# Patient Record
Sex: Male | Born: 1955 | Race: White | Hispanic: No | Marital: Married | Smoking: Never smoker
Health system: Southern US, Community
[De-identification: ages and names within clinical notes are randomized; demographics above are authoritative.]

## PROBLEM LIST (undated history)

## (undated) DIAGNOSIS — E7800 Pure hypercholesterolemia, unspecified: Secondary | ICD-10-CM

---

## 2017-04-16 ENCOUNTER — Encounter: Payer: Self-pay | Admitting: Emergency Medicine

## 2017-04-16 ENCOUNTER — Emergency Department (INDEPENDENT_AMBULATORY_CARE_PROVIDER_SITE_OTHER)
Admission: EM | Admit: 2017-04-16 | Discharge: 2017-04-16 | Disposition: A | Discharge status: Home / Self Care | Payer: No Typology Code available for payment source | Source: Home / Self Care | Attending: Family Medicine | Admitting: Family Medicine

## 2017-04-16 DIAGNOSIS — S0501XA Injury of conjunctiva and corneal abrasion without foreign body, right eye, initial encounter: Secondary | ICD-10-CM

## 2017-04-16 MED ORDER — GENTAMICIN SULFATE 0.3 % OP SOLN: 1.0000 [drp] | Freq: Four times a day (QID) | OPHTHALMIC | 0 refills | Status: AC

## 2017-04-16 NOTE — ED Triage Notes (Signed)
[facility] pain in the right eye he advised that approximately 1 hour ago he was struck in the right eye with a rake. Pain 4/10

## 2017-04-16 NOTE — ED Provider Notes (Signed)
CSN: [id number]     Arrival date & time 04/16/17  1628 History   First MD Initiated Contact with Patient 04/16/17 1655     Chief Complaint  Patient presents with  . Eye Injury   (Consider location/radiation/quality/duration/timing/severity/associated sxs/prior Treatment) HPI  Edward Hall is a 61 y.o. male presenting to UC with c/o Right eye pain that started about 1 hour PTA. Pt states lifting a box of items in his garage when a rake came down and hit his Right eye. He had his glasses on but they were knocked off.  Pain is aching, 4/10. No other injuries.    History reviewed. No pertinent past medical history. History reviewed. No pertinent surgical history. History reviewed. No pertinent family history. Social History  Substance Use Topics  . Smoking status: Never Smoker  . Smokeless tobacco: Never Used  . Alcohol use Yes    Review of Systems  Eyes: Positive for photophobia, pain, redness and visual disturbance.    Allergies  Patient has no allergy information on record.  Home Medications   Prior to Admission medications   Medication Sig Start Date End Date Taking? Authorizing Provider  levothyroxine (SYNTHROID, LEVOTHROID) 100 MCG tablet Take 100 mcg by mouth daily before breakfast.   Yes [provider]  simvastatin (ZOCOR) 10 MG tablet Take 10 mg by mouth daily.   Yes [provider]  gentamicin (GARAMYCIN) 0.3 % ophthalmic solution Place 1 drop into the right eye 4 (four) times daily. For 5 days 04/16/17   Lurene ShadowPhelps, Jahlen Bollman O, PA-C   Meds Ordered and Administered this Visit  Medications - No data to display  BP 131/81 (BP Location: Left Arm)   Pulse 92   Temp 98.4 F (36.9 C) (Oral)   Resp 16   Ht 5\' 10"  (1.778 m)   Wt 240 lb (108.9 kg)   SpO2 96%   BMI 34.44 kg/m  No data found.   Physical Exam  Constitutional: He is oriented to person, place, and time. He appears well-developed and well-nourished.  HENT:  Head: Normocephalic and atraumatic.    Eyes: Pupils are equal, round, and reactive to light. EOM and lids are normal. Lids are everted and swept, no foreign bodies found. Right conjunctiva is injected.    Right eye: injected to medial aspect with small corneal abrasion. No foreign bodies seen on exam.   Neck: Normal range of motion.  Cardiovascular: Normal rate.   Pulmonary/Chest: Effort normal.  Musculoskeletal: Normal range of motion.  Neurological: He is alert and oriented to person, place, and time.  Skin: Skin is warm and dry.  Psychiatric: He has a normal mood and affect. His behavior is normal.  Nursing note and vitals reviewed.   Urgent Care Course     Procedures (including critical care time)  Labs Review Labs Reviewed - No data to display  Imaging Review No results found.   Visual Acuity Review  With correction- eye glasses.   Right Eye Distance: 20/40 Left Eye Distance: 20/20 Bilateral Distance: 20/25   MDM   1. Injury of conjunctiva and corneal abrasion of right eye w/o FB, initial encounter    Corneal abrasion to Right eye.  Last tetanus within 5-9 years.   Encouraged to call his PCP on Monday to check status of tetanus.  Rx: gentamicin ophthalmic drops Home care instructions provided Encouraged f/u with PCP or ophthalmologist next week if not improving, call Monday with Dr. Dione BoozeGroat, ophthalmologist or use resource guide to find eye specialist if  symptoms worsening.     Lurene Shadowhelps, Jamin Humphries O, [place]-C 04/16/17 [phone]

## 2017-04-16 NOTE — Discharge Instructions (Signed)
°  Please call your primary care provider on Monday to check when your last tetanus shot was.  If it was beyond 6 or 7 years they may want you to get an updated vaccine due to recent eye injury.   If symptoms worsening this weekend, please call to schedule a follow up appointment with an eye specialist for a more detailed eye exam.

## 2017-04-19 ENCOUNTER — Telehealth: Payer: Self-pay

## 2017-04-19 NOTE — Telephone Encounter (Signed)
Better, will follow up as needed.

## 2019-01-25 ENCOUNTER — Other Ambulatory Visit: Payer: Self-pay

## 2019-01-25 ENCOUNTER — Emergency Department (HOSPITAL_COMMUNITY)
Admission: EM | Admit: 2019-01-25 | Discharge: 2019-01-26 | Disposition: A | Discharge status: Home / Self Care | Payer: PRIVATE HEALTH INSURANCE | Attending: Emergency Medicine | Admitting: Emergency Medicine

## 2019-01-25 ENCOUNTER — Encounter (HOSPITAL_COMMUNITY): Payer: Self-pay | Admitting: Emergency Medicine

## 2019-01-25 DIAGNOSIS — S79922A Unspecified injury of left thigh, initial encounter: Secondary | ICD-10-CM | POA: Diagnosis present

## 2019-01-25 DIAGNOSIS — Y939 Activity, unspecified: Secondary | ICD-10-CM | POA: Diagnosis not present

## 2019-01-25 DIAGNOSIS — S8001XA Contusion of right knee, initial encounter: Secondary | ICD-10-CM | POA: Diagnosis not present

## 2019-01-25 DIAGNOSIS — Y929 Unspecified place or not applicable: Secondary | ICD-10-CM | POA: Insufficient documentation

## 2019-01-25 DIAGNOSIS — Z79899 Other long term (current) drug therapy: Secondary | ICD-10-CM | POA: Diagnosis not present

## 2019-01-25 DIAGNOSIS — W19XXXA Unspecified fall, initial encounter: Secondary | ICD-10-CM

## 2019-01-25 DIAGNOSIS — Y999 Unspecified external cause status: Secondary | ICD-10-CM | POA: Diagnosis not present

## 2019-01-25 DIAGNOSIS — S76312A Strain of muscle, fascia and tendon of the posterior muscle group at thigh level, left thigh, initial encounter: Secondary | ICD-10-CM | POA: Diagnosis not present

## 2019-01-25 DIAGNOSIS — W1830XA Fall on same level, unspecified, initial encounter: Secondary | ICD-10-CM | POA: Diagnosis not present

## 2019-01-25 HISTORY — DX: Pure hypercholesterolemia, unspecified: E78.00

## 2019-01-25 MED ORDER — HYDROCODONE-ACETAMINOPHEN 5-325 MG PO TABS
1.0000 | ORAL_TABLET | Freq: Once | ORAL | Status: AC
  Administered 2019-01-25: 1 via ORAL
  Filled 2019-01-25: qty 1

## 2019-01-25 NOTE — ED Triage Notes (Signed)
Pt from home after fall landing on back and pulling hamstring of left leg and pain in right knee.   Pt. Denies loss of consciousness.

## 2019-01-25 NOTE — ED Notes (Signed)
Patient transported to X-ray 

## 2019-01-25 NOTE — ED Notes (Signed)
Patient continues to c/o pain in left thigh.  Assisted patient to get out of wheelchair and into a more comfortable chair.  Both legs warm and pink at this time.

## 2019-01-25 NOTE — ED Provider Notes (Signed)
[facility]  CSN: [id number] Arrival date & time: 01/25/19 1958  Chief Complaint(s) Fall  HPI Edward SimmondsRobert Hall is a 63 y.o. male who presents to the emergency department after a mechanical fall.  He reports that he slipped on a wet floor after getting out of the hot tub.  This occurred around 1900 p.m.  Reports that he injured his left hamstring and buttock as well as his right knee.  Denies any other injuries including head trauma.  He denies any neck pain, back pain, upper extremity pain.  Pain is deep aching.  Moderate to severe in intensity.  pain is exacerbated with movement.  Alleviated by mobility and applying ice.  Reports that he was not able to ambulate.   HPI  Past Medical History Past Medical History:  Diagnosis Date  . High cholesterol    There are no active problems to display for this patient.  Home Medication(s) Prior to Admission medications   Medication Sig Start Date End Date Taking? Authorizing Provider  diclofenac sodium (VOLTAREN) 1 % GEL Apply 2 g topically daily as needed (pain).   Yes [provider]  finasteride (PROSCAR) 5 MG tablet Take 5 mg by mouth daily. 01/11/19  Yes [provider]  hydroxychloroquine (PLAQUENIL) 200 MG tablet Take 200 mg by mouth 2 (two) times a day. 12/27/18  Yes [provider]  levothyroxine (SYNTHROID, LEVOTHROID) 100 MCG tablet Take 100 mcg by mouth daily before breakfast.   Yes [provider]  simvastatin (ZOCOR) 10 MG tablet Take 40 mg by mouth daily.    Yes [provider]  gentamicin (GARAMYCIN) 0.3 % ophthalmic solution Place 1 drop into the right eye 4 (four) times daily. For 5 days Patient not taking: Reported on 01/25/2019 04/16/17   Rolla PlatePhelps, Erin O, PA-C                                                                                                                                    Past Surgical History History reviewed. No pertinent  surgical history. Family History History reviewed. No pertinent family history.  Social History Social History   Tobacco Use  . Smoking status: Never Smoker  . Smokeless tobacco: Never Used  Substance Use Topics  . Alcohol use: Yes  . Drug use: No   Allergies Patient has no known allergies.  Review of Systems Review of Systems All other systems are reviewed and are negative for acute change except as noted in the HPI  Physical Exam Vital Signs  I have reviewed the triage vital signs BP 121/69 (BP Location: Right Arm)   Pulse 67   Temp (!) 97.5 F (36.4 C) (Oral)   Resp 18   Ht 5\' 11"  (1.803 m)   Wt 105.7 kg   SpO2 95%   BMI 32.50 kg/m   Physical Exam Constitutional:      General: He is  not in acute distress.    Appearance: He is well-developed. He is not diaphoretic.  HENT:     Head: Normocephalic.     Right Ear: External ear normal.     Left Ear: External ear normal.  Eyes:     General: No scleral icterus.       Right eye: No discharge.        Left eye: No discharge.     Conjunctiva/sclera: Conjunctivae normal.     Pupils: Pupils are equal, round, and reactive to light.  Neck:     Musculoskeletal: Normal range of motion and neck supple.  Cardiovascular:     Rate and Rhythm: Regular rhythm.     Pulses:          Radial pulses are 2+ on the right side and 2+ on the left side.       Dorsalis pedis pulses are 2+ on the right side and 2+ on the left side.     Heart sounds: Normal heart sounds. No murmur. No friction rub. No gallop.   Pulmonary:     Effort: Pulmonary effort is normal. No respiratory distress.     Breath sounds: Normal breath sounds. No stridor.  Abdominal:     General: There is no distension.     Palpations: Abdomen is soft.     Tenderness: There is no abdominal tenderness.  Musculoskeletal:     Right hip: He exhibits no tenderness.     Left hip: He exhibits no tenderness.     Right knee: He exhibits no swelling, no ecchymosis and no  deformity. Tenderness found. Medial joint line, MCL and LCL tenderness noted.     Cervical back: He exhibits no bony tenderness.     Thoracic back: He exhibits no bony tenderness.     Lumbar back: He exhibits no bony tenderness.     Left upper leg: He exhibits tenderness. He exhibits no bony tenderness, no swelling, no deformity and no laceration.       Legs:     Comments: Clavicle stable. Chest stable to AP/Lat compression. Pelvis stable to Lat compression. No obvious extremity deformity. No chest or abdominal wall contusion.  Skin:    General: Skin is warm.  Neurological:     Mental Status: He is alert and oriented to person, place, and time.     GCS: GCS eye subscore is 4. GCS verbal subscore is 5. GCS motor subscore is 6.     Comments: Moving all extremities      ED Results and Treatments Labs (all labs ordered are listed, but only abnormal results are displayed) Labs Reviewed - No data to display                                                                                                                       EKG  EKG Interpretation  Date/Time:    Ventricular Rate:    PR Interval:    QRS Duration:   QT Interval:  QTC Calculation:   R Axis:     Text Interpretation:        Radiology Dg Knee Complete 4 Views Right  Result Date: 01/26/2019 CLINICAL DATA:  63 y/o  M; fall with left thigh and right knee pain. EXAM: RIGHT KNEE - COMPLETE 4+ VIEW COMPARISON:  None. FINDINGS: No evidence of fracture, dislocation, or joint effusion. Mild osteoarthrosis of the knee joint with small patellofemoral periarticular osteophytes and spurring of the tibial spines. Superior and inferior patellar enthesophytes. IMPRESSION: 1. No acute fracture or dislocation identified. 2. Mild osteoarthrosis of the knee joint. Electronically Signed   By: Mitzi Hansen M.D.   On: 01/26/2019 00:20   Dg Hip Unilat W Or W/o Pelvis 2-3 Views Left  Result Date: 01/26/2019 CLINICAL DATA:  63  y/o  M; fall with left thigh and right knee pain. EXAM: DG HIP (WITH OR WITHOUT PELVIS) 2-3V LEFT COMPARISON:  None. FINDINGS: There is no evidence of hip fracture or dislocation. There is no evidence of arthropathy or other focal bone abnormality. IMPRESSION: Negative. Electronically Signed   By: Mitzi Hansen M.D.   On: 01/26/2019 00:21   Pertinent labs & imaging results that were available during my care of the patient were reviewed by me and considered in my medical decision making (see chart for details).  Medications Ordered in ED Medications  HYDROcodone-acetaminophen (NORCO/VICODIN) 5-325 MG per tablet 1 tablet (1 tablet Oral Given 01/25/19 2342)                                                                                                                                    Procedures Procedures  (including critical care time)  Medical Decision Making / ED Course I have reviewed the nursing notes for this encounter and the patient's prior records (if available in EHR or on provided paperwork).    Mechanical fall resulting in left hamstring and gluteus pain as well as right knee pain.  Plain films negative.  Patient neurovascular intact distally.  No large ecchymosis.  No midline tenderness.  Evaluated with oral pain medicine and crutches.  Patient declined any braces or splints.  Patient already has an appointment with orthopedist in 2 weeks.  Instructed to follow-up with him.  The patient appears reasonably screened and/or stabilized for discharge and I doubt any other medical condition or other [facility] requiring further screening, evaluation, or treatment in the ED at this time prior to discharge.  The patient is safe for discharge with strict return precautions.   Final Clinical Impression(s) / ED Diagnoses Final diagnoses:  Fall  Left hamstring strain, initial encounter  Contusion of right knee, initial encounter    Disposition: Discharge  Condition: Good  I  have discussed the results, Dx and Tx plan with the patient who expressed understanding and agree(s) with the plan. Discharge instructions discussed at great length. The patient was given strict return precautions who verbalized understanding of  the instructions. No further questions at time of discharge.    ED Discharge Orders    None       Follow Up: Orthopedist  Schedule an appointment as soon as possible for a visit    Teodoro Spray, MD [address] [place] [address] [phone]   As needed     This chart was dictated using voice recognition software.  Despite best efforts to proofread,  errors can occur which can change the documentation meaning.   Nira Conn, MD 01/26/19 0201

## 2019-01-26 ENCOUNTER — Emergency Department (HOSPITAL_COMMUNITY): Payer: PRIVATE HEALTH INSURANCE

## 2019-01-26 NOTE — Discharge Instructions (Signed)
You may take over-the-counter Tylenol or ibuprofen for pain.  Use crutches as needed.

## 2020-05-26 IMAGING — DX RIGHT KNEE - COMPLETE 4+ VIEW
4 series · 4 of 4 positions shown · non-contrast
Comparison: None.

CLINICAL DATA: 63 y/o  M; fall with left thigh and right knee pain.

EXAM:
RIGHT KNEE - COMPLETE 4+ VIEW

[knee ap]
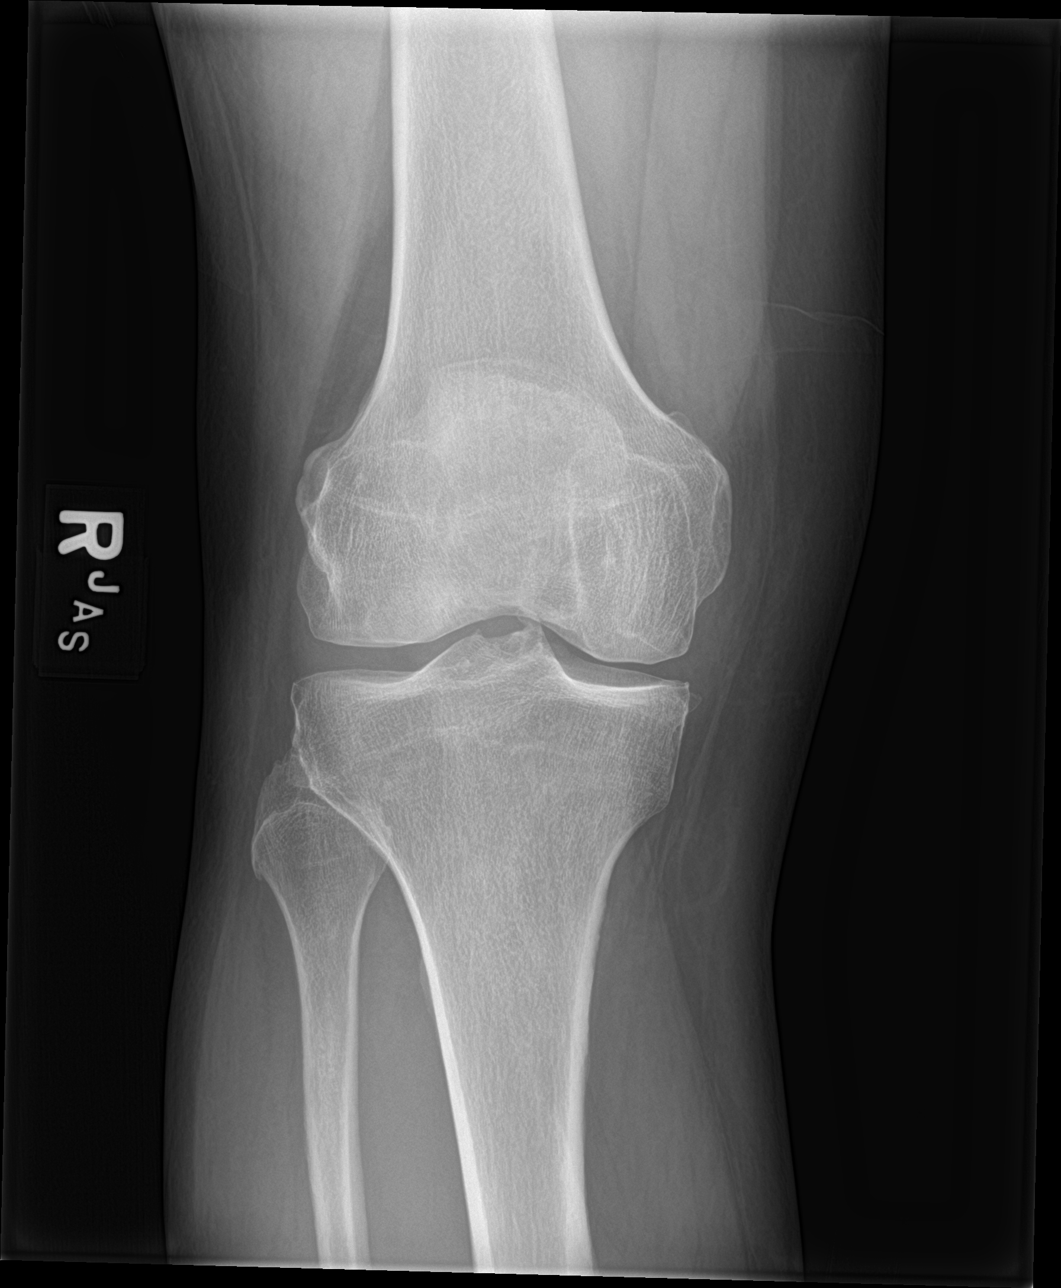

[knee lat]
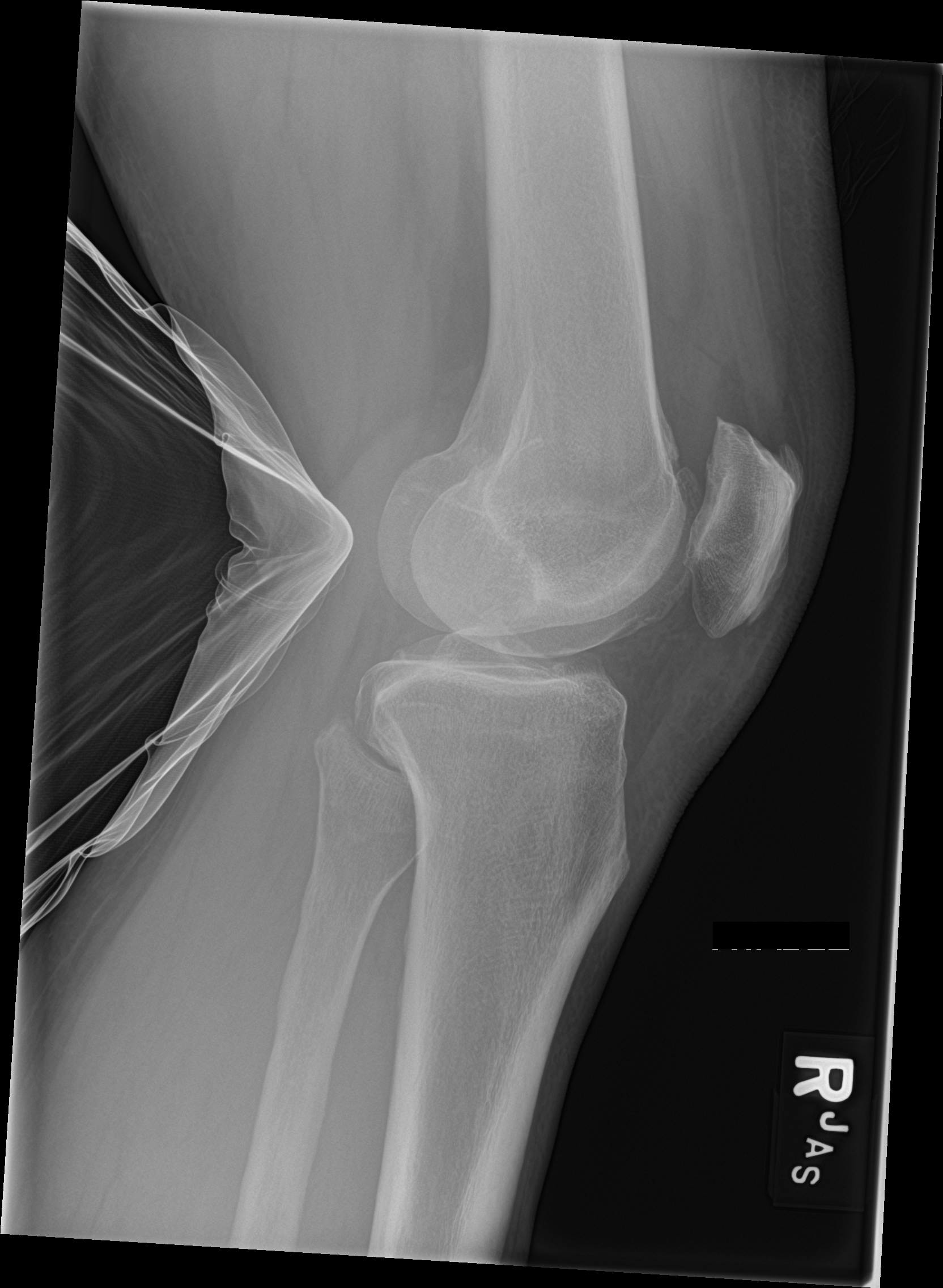

[knee obl (1 of 2)]
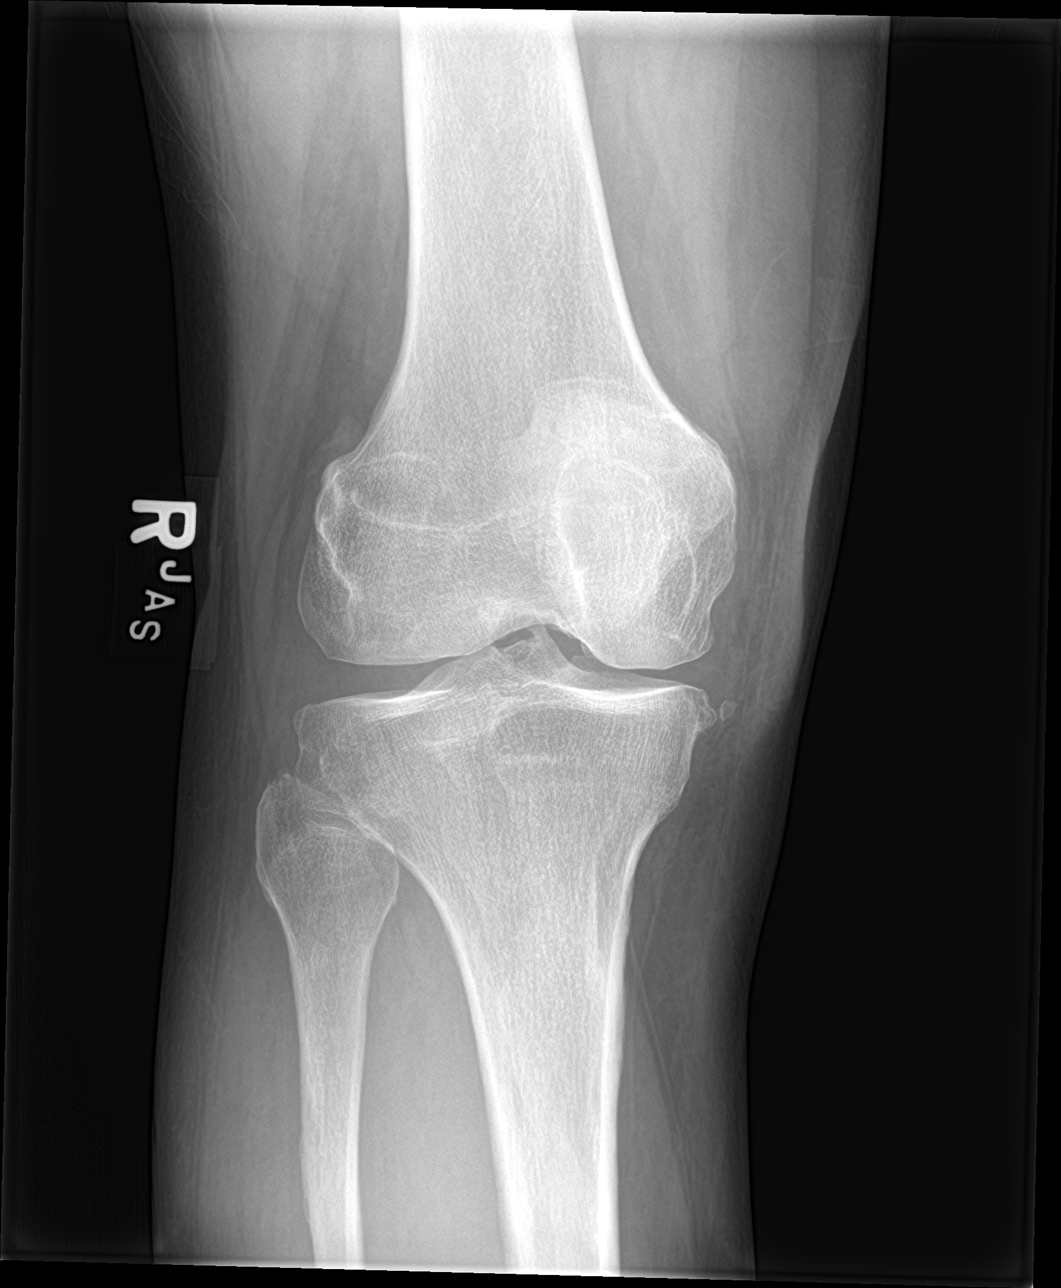

[knee obl (2 of 2)]
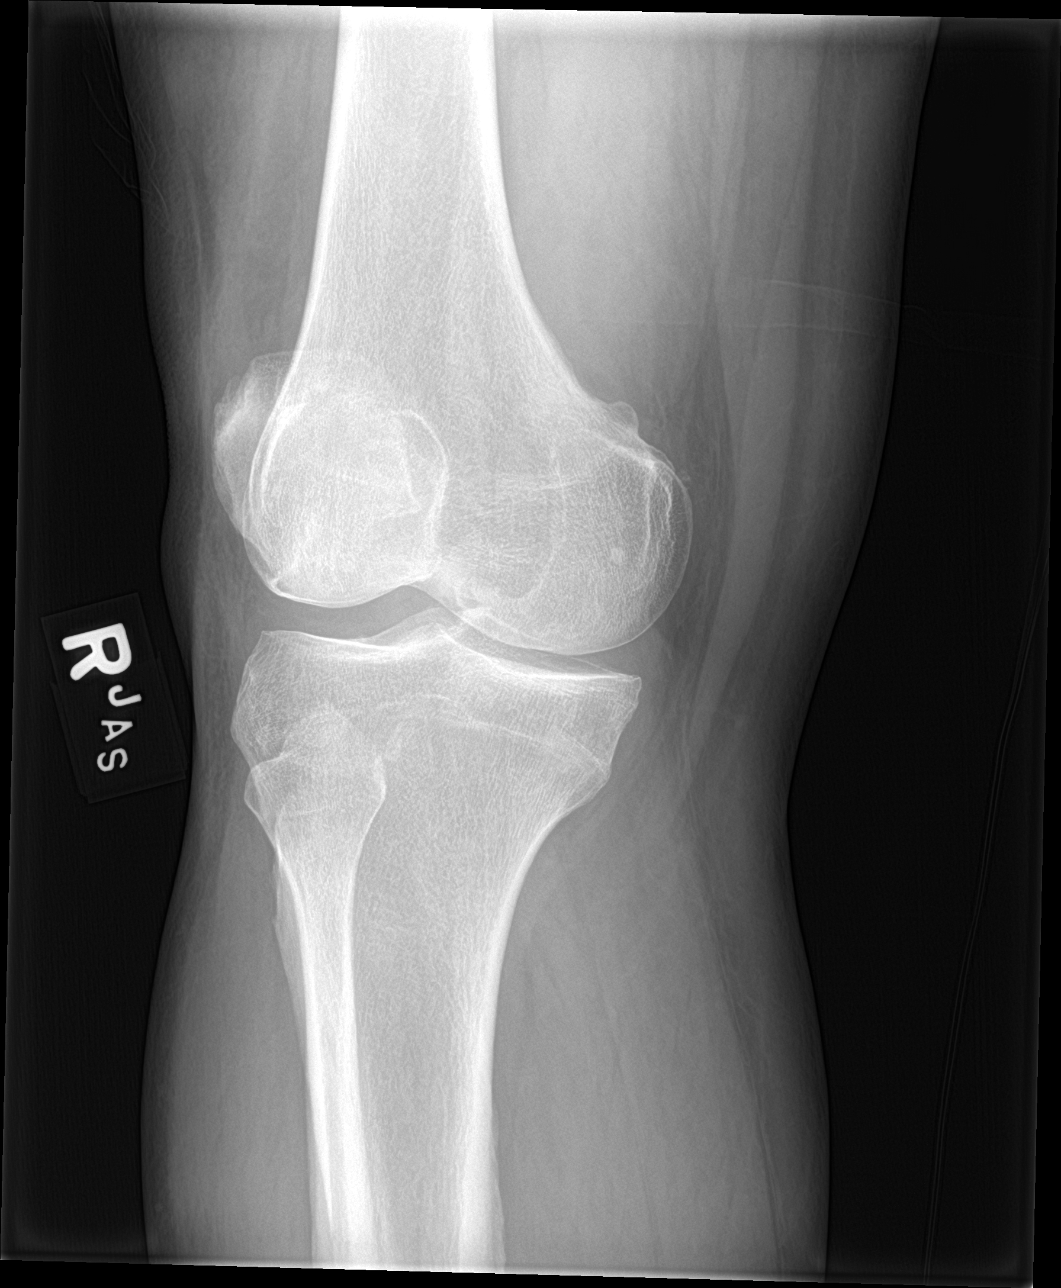

[4 of 4 positions shown; findings below may reference images not displayed]

FINDINGS: No evidence of fracture, dislocation, or joint effusion. Mild
osteoarthrosis of the knee joint with small patellofemoral
periarticular osteophytes and spurring of the tibial spines.
Superior and inferior patellar enthesophytes.
IMPRESSION: 1. No acute fracture or dislocation identified.
2. Mild osteoarthrosis of the knee joint.
